# Patient Record
Sex: Female | Born: 1940 | Race: White | Hispanic: No | Marital: Married | Smoking: Never smoker
Health system: Southern US, Community
[De-identification: ages and names within clinical notes are randomized; demographics above are authoritative.]

## PROBLEM LIST (undated history)

## (undated) DIAGNOSIS — E079 Disorder of thyroid, unspecified: Secondary | ICD-10-CM

## (undated) DIAGNOSIS — F319 Bipolar disorder, unspecified: Secondary | ICD-10-CM

## (undated) HISTORY — PX: ANKLE SURGERY: SHX546

## (undated) HISTORY — PX: KNEE SURGERY: SHX244

---

## 2005-05-19 ENCOUNTER — Ambulatory Visit: Payer: Self-pay | Admitting: Family Medicine

## 2005-05-28 ENCOUNTER — Encounter: Admission: RE | Admit: 2005-05-28 | Discharge: 2005-05-28 | Payer: Self-pay | Admitting: Family Medicine

## 2005-07-08 ENCOUNTER — Ambulatory Visit: Payer: Self-pay | Admitting: Family Medicine

## 2005-07-14 ENCOUNTER — Ambulatory Visit: Payer: Self-pay | Admitting: Family Medicine

## 2005-07-14 ENCOUNTER — Other Ambulatory Visit: Admission: RE | Admit: 2005-07-14 | Discharge: 2005-07-14 | Payer: Self-pay | Admitting: Family Medicine

## 2005-10-06 ENCOUNTER — Ambulatory Visit: Payer: Self-pay | Admitting: Family Medicine

## 2005-11-03 ENCOUNTER — Ambulatory Visit: Payer: Self-pay | Admitting: Family Medicine

## 2005-12-08 ENCOUNTER — Encounter: Payer: Self-pay | Admitting: Family Medicine

## 2005-12-08 LAB — CONVERTED CEMR LAB

## 2006-03-02 ENCOUNTER — Ambulatory Visit: Payer: Self-pay | Admitting: Family Medicine

## 2006-06-16 ENCOUNTER — Encounter: Admission: RE | Admit: 2006-06-16 | Discharge: 2006-06-16 | Payer: Self-pay | Admitting: Family Medicine

## 2006-10-15 ENCOUNTER — Ambulatory Visit: Payer: Self-pay | Admitting: Family Medicine

## 2006-10-15 LAB — CONVERTED CEMR LAB
ALT: 19 units/L (ref 0–40)
Albumin: 4 g/dL (ref 3.5–5.2)
Alkaline Phosphatase: 81 units/L (ref 39–117)
Basophils Absolute: 0 10*3/uL (ref 0.0–0.1)
Basophils Relative: 0.4 % (ref 0.0–1.0)
CO2: 32 meq/L (ref 19–32)
Chol/HDL Ratio, serum: 3.8
Cholesterol: 188 mg/dL (ref 0–200)
GFR calc non Af Amer: 60 mL/min
Glomerular Filtration Rate, Af Am: 72 mL/min/{1.73_m2}
Glucose, Bld: 102 mg/dL — ABNORMAL HIGH (ref 70–99)
LDL Cholesterol: 114 mg/dL — ABNORMAL HIGH (ref 0–99)
Lymphocytes Relative: 27.2 % (ref 12.0–46.0)
Monocytes Relative: 5.5 % (ref 3.0–11.0)
Platelets: 293 10*3/uL (ref 150–400)
Potassium: 4.6 meq/L (ref 3.5–5.1)
RBC: 4.81 M/uL (ref 3.87–5.11)
Sodium: 144 meq/L (ref 135–145)
TSH: 1.51 microintl units/mL (ref 0.35–5.50)
Total Bilirubin: 0.9 mg/dL (ref 0.3–1.2)
Total Protein: 7.5 g/dL (ref 6.0–8.3)
VLDL: 24 mg/dL (ref 0–40)
WBC: 4.8 10*3/uL (ref 4.5–10.5)

## 2006-11-13 ENCOUNTER — Ambulatory Visit: Payer: Self-pay | Admitting: Family Medicine

## 2006-11-13 ENCOUNTER — Other Ambulatory Visit: Admission: RE | Admit: 2006-11-13 | Discharge: 2006-11-13 | Payer: Self-pay | Admitting: Family Medicine

## 2006-11-13 ENCOUNTER — Encounter: Payer: Self-pay | Admitting: Family Medicine

## 2006-11-30 ENCOUNTER — Encounter: Admission: RE | Admit: 2006-11-30 | Discharge: 2006-11-30 | Payer: Self-pay | Admitting: Orthopedic Surgery

## 2006-12-16 ENCOUNTER — Ambulatory Visit: Payer: Self-pay | Admitting: Family Medicine

## 2007-01-07 ENCOUNTER — Ambulatory Visit: Payer: Self-pay | Admitting: Family Medicine

## 2007-01-18 ENCOUNTER — Ambulatory Visit: Payer: Self-pay | Admitting: Internal Medicine

## 2007-07-08 ENCOUNTER — Encounter: Admission: RE | Admit: 2007-07-08 | Discharge: 2007-07-08 | Payer: Self-pay | Admitting: Family Medicine

## 2007-08-17 DIAGNOSIS — M1610 Unilateral primary osteoarthritis, unspecified hip: Secondary | ICD-10-CM | POA: Insufficient documentation

## 2007-08-17 DIAGNOSIS — M169 Osteoarthritis of hip, unspecified: Secondary | ICD-10-CM

## 2007-08-20 ENCOUNTER — Ambulatory Visit: Payer: Self-pay | Admitting: Family Medicine

## 2007-09-04 ENCOUNTER — Encounter: Payer: Self-pay | Admitting: Family Medicine

## 2007-09-04 DIAGNOSIS — F319 Bipolar disorder, unspecified: Secondary | ICD-10-CM | POA: Insufficient documentation

## 2007-09-04 DIAGNOSIS — R32 Unspecified urinary incontinence: Secondary | ICD-10-CM | POA: Insufficient documentation

## 2007-09-04 DIAGNOSIS — E039 Hypothyroidism, unspecified: Secondary | ICD-10-CM | POA: Insufficient documentation

## 2007-09-16 ENCOUNTER — Encounter: Payer: Self-pay | Admitting: Family Medicine

## 2007-09-16 ENCOUNTER — Ambulatory Visit: Payer: Self-pay | Admitting: Family Medicine

## 2007-09-16 ENCOUNTER — Encounter (INDEPENDENT_AMBULATORY_CARE_PROVIDER_SITE_OTHER): Payer: Self-pay | Admitting: *Deleted

## 2008-07-12 ENCOUNTER — Encounter: Admission: RE | Admit: 2008-07-12 | Discharge: 2008-07-12 | Payer: Self-pay | Admitting: Family Medicine

## 2008-07-20 ENCOUNTER — Encounter: Admission: RE | Admit: 2008-07-20 | Discharge: 2008-07-20 | Payer: Self-pay | Admitting: Family Medicine

## 2008-08-18 IMAGING — CR DG WRIST*L* EXAM
1 series · 1 of 1 positions shown · non-contrast
Comparison: none

[AP]
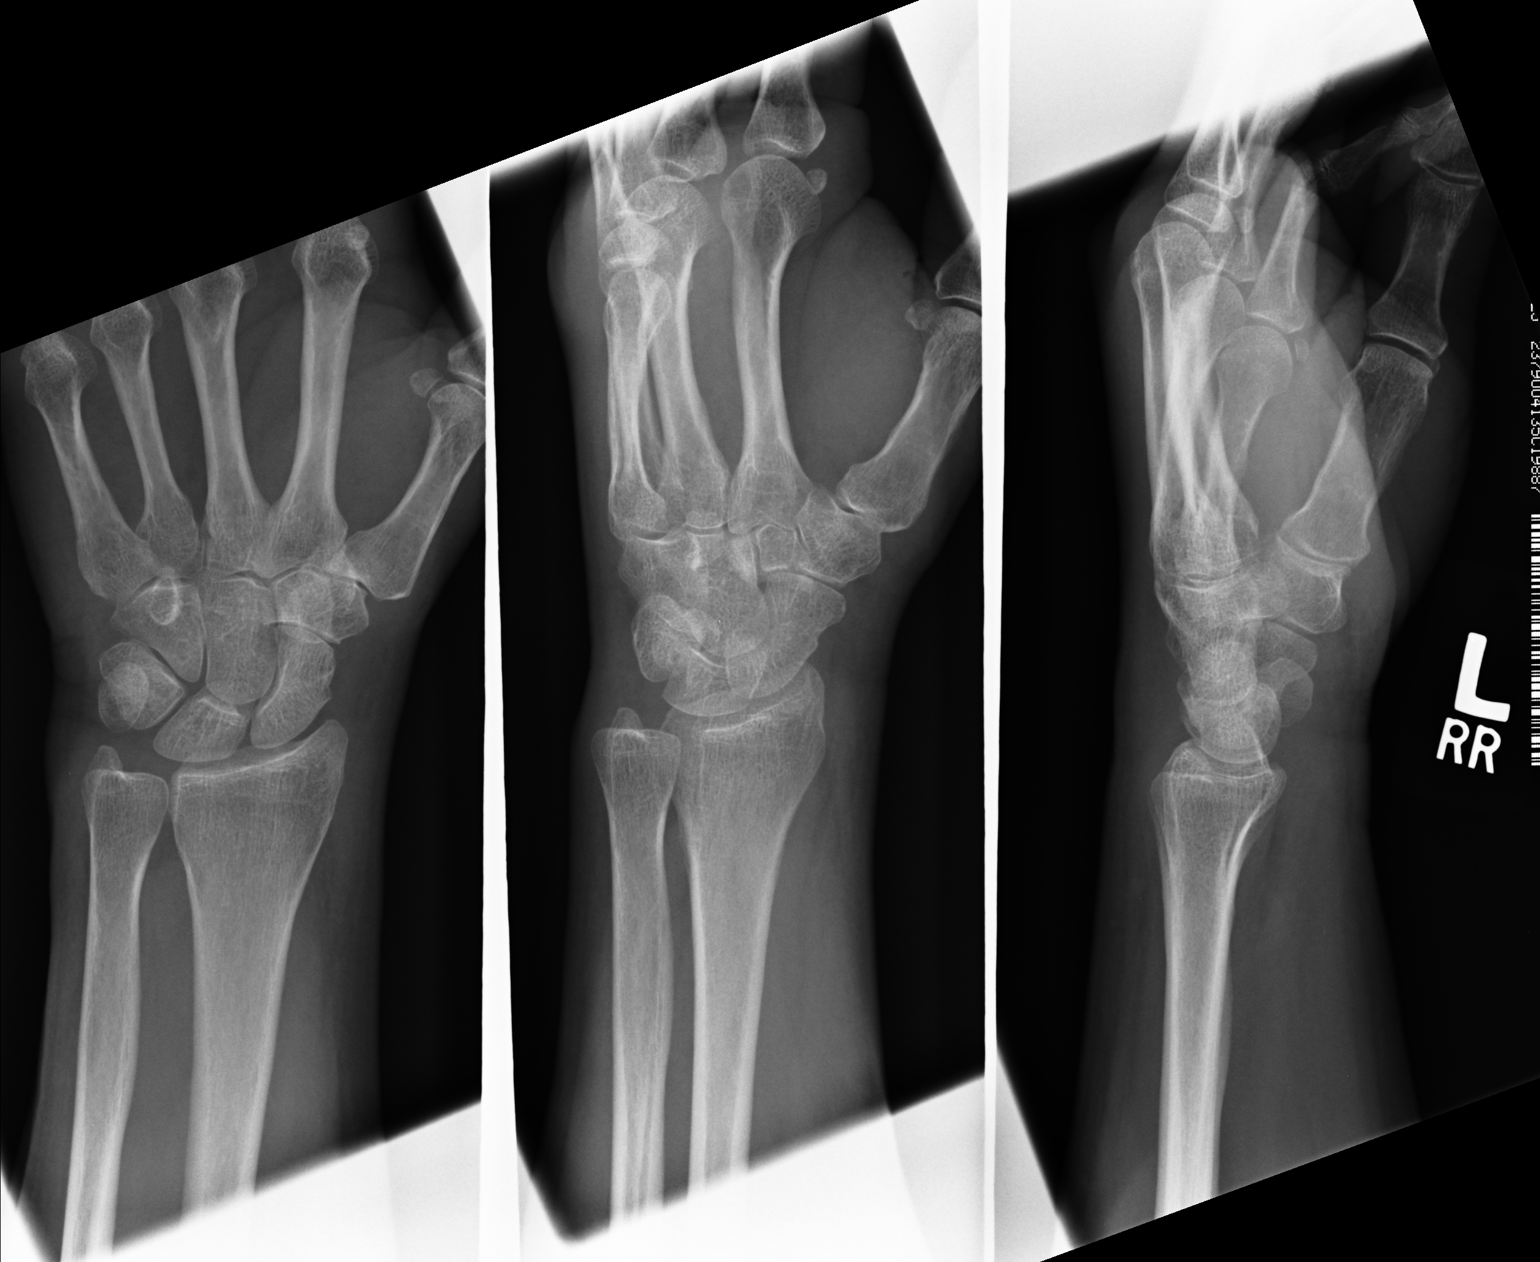

[1 of 1 positions shown; findings below may reference images not displayed]

Canned report from images found in remote index.

Refer to host system for actual result text.

## 2009-05-17 ENCOUNTER — Encounter: Admission: RE | Admit: 2009-05-17 | Discharge: 2009-07-09 | Payer: Self-pay | Admitting: Orthopedic Surgery

## 2009-07-17 ENCOUNTER — Encounter: Admission: RE | Admit: 2009-07-17 | Discharge: 2009-07-17 | Payer: Self-pay | Admitting: Family Medicine

## 2010-07-18 ENCOUNTER — Encounter: Admission: RE | Admit: 2010-07-18 | Discharge: 2010-07-18 | Payer: Self-pay | Admitting: Family Medicine

## 2010-12-29 ENCOUNTER — Encounter: Payer: Self-pay | Admitting: Orthopedic Surgery

## 2011-06-24 ENCOUNTER — Emergency Department (HOSPITAL_COMMUNITY)
Admission: EM | Admit: 2011-06-24 | Discharge: 2011-06-24 | Disposition: A | Discharge status: Home / Self Care | Payer: Medicare Other | Attending: Emergency Medicine | Admitting: Emergency Medicine

## 2011-06-24 DIAGNOSIS — E039 Hypothyroidism, unspecified: Secondary | ICD-10-CM | POA: Insufficient documentation

## 2011-06-24 DIAGNOSIS — F319 Bipolar disorder, unspecified: Secondary | ICD-10-CM | POA: Insufficient documentation

## 2011-06-24 DIAGNOSIS — Z79899 Other long term (current) drug therapy: Secondary | ICD-10-CM | POA: Insufficient documentation

## 2011-06-24 DIAGNOSIS — M79609 Pain in unspecified limb: Secondary | ICD-10-CM | POA: Insufficient documentation

## 2011-07-03 ENCOUNTER — Other Ambulatory Visit: Payer: Self-pay | Admitting: Family Medicine

## 2011-07-03 DIAGNOSIS — Z1231 Encounter for screening mammogram for malignant neoplasm of breast: Secondary | ICD-10-CM

## 2011-07-23 ENCOUNTER — Ambulatory Visit
Admission: RE | Admit: 2011-07-23 | Discharge: 2011-07-23 | Disposition: A | Discharge status: Home / Self Care | Payer: Medicare Other | Source: Ambulatory Visit | Attending: Family Medicine | Admitting: Family Medicine

## 2011-07-23 DIAGNOSIS — Z1231 Encounter for screening mammogram for malignant neoplasm of breast: Secondary | ICD-10-CM

## 2012-06-21 ENCOUNTER — Other Ambulatory Visit: Payer: Self-pay | Admitting: Family Medicine

## 2012-06-21 DIAGNOSIS — Z1231 Encounter for screening mammogram for malignant neoplasm of breast: Secondary | ICD-10-CM

## 2012-07-26 ENCOUNTER — Ambulatory Visit: Payer: Medicare Other

## 2012-07-27 ENCOUNTER — Ambulatory Visit: Payer: Medicare Other

## 2012-07-30 ENCOUNTER — Ambulatory Visit
Admission: RE | Admit: 2012-07-30 | Discharge: 2012-07-30 | Disposition: A | Discharge status: Home / Self Care | Payer: Medicare Other | Source: Ambulatory Visit | Attending: Family Medicine | Admitting: Family Medicine

## 2012-07-30 DIAGNOSIS — Z1231 Encounter for screening mammogram for malignant neoplasm of breast: Secondary | ICD-10-CM

## 2012-08-03 ENCOUNTER — Other Ambulatory Visit: Payer: Self-pay | Admitting: Family Medicine

## 2012-08-03 DIAGNOSIS — Z780 Asymptomatic menopausal state: Secondary | ICD-10-CM

## 2012-08-06 ENCOUNTER — Other Ambulatory Visit: Payer: Medicare Other

## 2012-08-16 ENCOUNTER — Other Ambulatory Visit: Payer: Medicare Other

## 2012-08-16 ENCOUNTER — Ambulatory Visit
Admission: RE | Admit: 2012-08-16 | Discharge: 2012-08-16 | Disposition: A | Discharge status: Home / Self Care | Payer: Medicare Other | Source: Ambulatory Visit | Attending: Family Medicine | Admitting: Family Medicine

## 2012-08-16 DIAGNOSIS — Z780 Asymptomatic menopausal state: Secondary | ICD-10-CM

## 2013-06-21 ENCOUNTER — Other Ambulatory Visit: Payer: Self-pay

## 2013-06-21 DIAGNOSIS — Z1231 Encounter for screening mammogram for malignant neoplasm of breast: Secondary | ICD-10-CM

## 2013-08-01 ENCOUNTER — Ambulatory Visit: Payer: Medicare Other

## 2013-08-29 ENCOUNTER — Ambulatory Visit
Admission: RE | Admit: 2013-08-29 | Discharge: 2013-08-29 | Disposition: A | Discharge status: Home / Self Care | Payer: Medicare Other | Source: Ambulatory Visit

## 2013-08-29 DIAGNOSIS — Z1231 Encounter for screening mammogram for malignant neoplasm of breast: Secondary | ICD-10-CM

## 2014-07-28 ENCOUNTER — Other Ambulatory Visit: Payer: Self-pay

## 2014-07-28 DIAGNOSIS — Z1231 Encounter for screening mammogram for malignant neoplasm of breast: Secondary | ICD-10-CM

## 2014-09-19 ENCOUNTER — Ambulatory Visit
Admission: RE | Admit: 2014-09-19 | Discharge: 2014-09-19 | Disposition: A | Discharge status: Home / Self Care | Payer: Medicare Other | Source: Ambulatory Visit

## 2014-09-19 DIAGNOSIS — Z1231 Encounter for screening mammogram for malignant neoplasm of breast: Secondary | ICD-10-CM

## 2014-10-22 ENCOUNTER — Emergency Department (HOSPITAL_BASED_OUTPATIENT_CLINIC_OR_DEPARTMENT_OTHER)
Admission: EM | Admit: 2014-10-22 | Discharge: 2014-10-22 | Disposition: A | Discharge status: Home / Self Care | Payer: Medicare Other | Attending: Emergency Medicine | Admitting: Emergency Medicine

## 2014-10-22 ENCOUNTER — Encounter (HOSPITAL_BASED_OUTPATIENT_CLINIC_OR_DEPARTMENT_OTHER): Payer: Self-pay | Admitting: *Deleted

## 2014-10-22 ENCOUNTER — Emergency Department (HOSPITAL_BASED_OUTPATIENT_CLINIC_OR_DEPARTMENT_OTHER): Payer: Medicare Other

## 2014-10-22 DIAGNOSIS — Y9389 Activity, other specified: Secondary | ICD-10-CM | POA: Insufficient documentation

## 2014-10-22 DIAGNOSIS — Y998 Other external cause status: Secondary | ICD-10-CM | POA: Diagnosis not present

## 2014-10-22 DIAGNOSIS — S92902A Unspecified fracture of left foot, initial encounter for closed fracture: Secondary | ICD-10-CM

## 2014-10-22 DIAGNOSIS — S92315A Nondisplaced fracture of first metatarsal bone, left foot, initial encounter for closed fracture: Secondary | ICD-10-CM | POA: Diagnosis not present

## 2014-10-22 DIAGNOSIS — E079 Disorder of thyroid, unspecified: Secondary | ICD-10-CM | POA: Diagnosis not present

## 2014-10-22 DIAGNOSIS — S99922A Unspecified injury of left foot, initial encounter: Secondary | ICD-10-CM | POA: Diagnosis present

## 2014-10-22 DIAGNOSIS — F319 Bipolar disorder, unspecified: Secondary | ICD-10-CM | POA: Diagnosis not present

## 2014-10-22 DIAGNOSIS — W01198A Fall on same level from slipping, tripping and stumbling with subsequent striking against other object, initial encounter: Secondary | ICD-10-CM | POA: Diagnosis not present

## 2014-10-22 DIAGNOSIS — Y929 Unspecified place or not applicable: Secondary | ICD-10-CM | POA: Diagnosis not present

## 2014-10-22 DIAGNOSIS — W19XXXA Unspecified fall, initial encounter: Secondary | ICD-10-CM

## 2014-10-22 HISTORY — DX: Bipolar disorder, unspecified: F31.9

## 2014-10-22 HISTORY — DX: Disorder of thyroid, unspecified: E07.9

## 2014-10-22 MED ORDER — TRAMADOL HCL 50 MG PO TABS
50.0000 mg | ORAL_TABLET | Freq: Four times a day (QID) | ORAL | Status: AC | PRN
Start: 2014-10-22 — End: ?

## 2014-10-22 MED ORDER — TRAMADOL HCL 50 MG PO TABS
50.0000 mg | ORAL_TABLET | Freq: Once | ORAL | Status: AC
Start: 2014-10-22 — End: 2014-10-22
  Administered 2014-10-22: 50 mg via ORAL
  Filled 2014-10-22: qty 1

## 2014-10-22 NOTE — ED Provider Notes (Signed)
CSN: [id number]     Arrival date & time 10/22/14  0912 History   First MD Initiated Contact with Patient 10/22/14 [phone]     Chief Complaint  Patient presents with  . Fall     (Consider location/radiation/quality/duration/timing/severity/associated sxs/prior Treatment) Patient is a 73 y.o. female presenting with fall. The history is provided by the patient.  Fall Pertinent negatives include no chest pain, no abdominal pain, no headaches and no shortness of breath.  patient slipped in the shower this morning. Did not pass out no loss of consciousness. Injury isolated to the left forefoot. Patient with pain and swelling there. Patient not able to walk on the foot. No other injuries.  Past Medical History  Diagnosis Date  . Thyroid disease   . Bipolar disorder    Past Surgical History  Procedure Laterality Date  . Ankle surgery Left   . Knee surgery     History reviewed. No pertinent family history. History  Substance Use Topics  . Smoking status: Never Smoker   . Smokeless tobacco: Not on file  . Alcohol Use: Yes   OB History    No data available     Review of Systems  Constitutional: Negative for fever.  HENT: Negative for congestion.   Eyes: Negative for visual disturbance.  Respiratory: Negative for shortness of breath.   Cardiovascular: Negative for chest pain.  Gastrointestinal: Negative for abdominal pain.  Genitourinary: Negative for hematuria.  Musculoskeletal: Positive for joint swelling. Negative for back pain and neck pain.  Skin: Negative for rash.  Neurological: Negative for headaches.  Hematological: Does not bruise/bleed easily.  Psychiatric/Behavioral: Negative for confusion.      Allergies  Aspirin and Sulfonamide derivatives  Home Medications   Prior to Admission medications   Medication Sig Start Date End Date Taking? Authorizing Provider  LamoTRIgine (LAMICTAL PO) Take by mouth.   Yes Historical Provider, MD  LEVOTHYROXINE SODIUM PO Take  by mouth.   Yes Historical Provider, MD  LITHIUM PO Take by mouth.   Yes Historical Provider, MD  Pramipexole Dihydrochloride (MIRAPEX PO) Take by mouth.   Yes Historical Provider, MD  traMADol (ULTRAM) 50 MG tablet Take 1 tablet (50 mg total) by mouth every 6 (six) hours as needed. 10/22/14   Vanetta MuldersScott Daxon Kyne, MD   BP 108/85 mmHg  Pulse 71  Temp(Src) 98.5 F (36.9 C) (Oral)  Resp 18  Ht 5\' 6"  (1.676 m)  Wt 180 lb (81.647 kg)  BMI 29.07 kg/m2  SpO2 94% Physical Exam  Constitutional: She is oriented to person, place, and time. She appears well-developed and well-nourished. No distress.  HENT:  Head: Normocephalic and atraumatic.  Eyes: Conjunctivae and EOM are normal. Pupils are equal, round, and reactive to light.  Neck: Normal range of motion.  Cardiovascular: Normal rate, regular rhythm and normal heart sounds.   No murmur heard. Pulmonary/Chest: Effort normal and breath sounds normal. No respiratory distress.  Abdominal: Soft. Bowel sounds are normal. There is no tenderness.  Musculoskeletal: Normal range of motion. She exhibits edema and tenderness.  Patient was swelling to the left forefoot. Some evidence of bruising. Refill to great toe is 2 seconds. No ankle swelling. No proximal leg tenderness. No knee tenderness. No knee tenderness.  Neurological: She is alert and oriented to person, place, and time. No cranial nerve deficit. She exhibits normal muscle tone. Coordination normal.  Skin: Skin is warm. No rash noted.  Nursing note and vitals reviewed.   ED Course  Procedures (including critical care  time) Labs Review Labs Reviewed - No data to display  Imaging Review Dg Foot Complete Left  10/22/2014   CLINICAL DATA:  Fall, acute pain and bruising across the metatarsals  EXAM: LEFT FOOT - COMPLETE 3+ VIEW  COMPARISON:  None.  FINDINGS: Normal alignment. Mild osteopenia. Degenerative changes and mild hallux deformity of the left first MTP joint. On the oblique view, subtle  lucency and cortical step-off suspected of the left first metatarsal along the articular surface. This is suspicious for a first metatarsal nondisplaced fracture. The other metatarsals appear intact. No other significant abnormality.  IMPRESSION: Findings suspicious for a subtle nondisplaced fracture left first metatarsal base.  Osteopenia and left first MTP degenerative change and hallux valgus deformity.   Electronically Signed   By: Ruel Favorsrevor  Shick M.D.   On: 10/22/2014 10:37     EKG Interpretation None      MDM   Final diagnoses:  Fall  Foot fracture, left, closed, initial encounter    Patient with injury to her left foot. No ankle tenderness no proximal leg tenderness no knee tenderness. Patient was swelling to the forefoot. X-rays are consistent with a nondisplaced fracture of the left first metatarsal base.  Patient splinted with a posterior splint. Nonweightbearing crutches provided. Pain medication provided. No other injuries. Placement of the splint is proper. Patient has good cap refill following placement of the splint.    Vanetta MuldersScott Adelynn Gipe, MD 10/22/14 1120

## 2014-10-22 NOTE — ED Notes (Signed)
Patient states she slipped in the shower this morning and c/o pain in left ankle/foot.

## 2014-10-22 NOTE — Discharge Instructions (Signed)
Cast or Splint Care Casts and splints support injured limbs and keep bones from moving while they heal.  HOME CARE  Keep the cast or splint uncovered during the drying period.  A plaster cast can take 24 to 48 hours to dry.  A fiberglass cast will dry in less than 1 hour.  Do not rest the cast on anything harder than a pillow for 24 hours.  Do not put weight on your injured limb. Do not put pressure on the cast. Wait for your doctor's approval.  Keep the cast or splint dry.  Cover the cast or splint with a plastic bag during baths or wet weather.  If you have a cast over your chest and belly (trunk), take sponge baths until the cast is taken off.  If your cast gets wet, dry it with a towel or blow dryer. Use the cool setting on the blow dryer.  Keep your cast or splint clean. Wash a dirty cast with a damp cloth.  Do not put any objects under your cast or splint.  Do not scratch the skin under the cast with an object. If itching is a problem, use a blow dryer on a cool setting over the itchy area.  Do not trim or cut your cast.  Do not take out the padding from inside your cast.  Exercise your joints near the cast as told by your doctor.  Raise (elevate) your injured limb on 1 or 2 pillows for the first 1 to 3 days. GET HELP IF:  Your cast or splint cracks.  Your cast or splint is too tight or too loose.  You itch badly under the cast.  Your cast gets wet or has a soft spot.  You have a bad smell coming from the cast.  You get an object stuck under the cast.  Your skin around the cast becomes red or sore.  You have new or more pain after the cast is put on. GET HELP RIGHT AWAY IF:  You have fluid leaking through the cast.  You cannot move your fingers or toes.  Your fingers or toes turn blue or white or are cool, painful, or puffy (swollen).  You have tingling or lose feeling (numbness) around the injured area.  You have bad pain or pressure under the  cast.  You have trouble breathing or have shortness of breath.  You have chest pain. Document Released: 03/26/2011 Document Revised: 07/27/2013 Document Reviewed: 06/02/2013 [facility] Patient Information 2015 [employer], [place]. This information is not intended to replace advice given to you by your health care provider. Make sure you discuss any questions you have with your health care provider.  Follow-up with your orthopedic doctor. Give them a call on Monday. Keep the splint dry. Elevate your left leg is much as possible. Use the crutches. Do not put weight on the left foot.

## 2014-10-22 NOTE — ED Notes (Signed)
Pt given ice pack

## 2014-12-14 DIAGNOSIS — E039 Hypothyroidism, unspecified: Secondary | ICD-10-CM | POA: Diagnosis not present

## 2014-12-14 DIAGNOSIS — F319 Bipolar disorder, unspecified: Secondary | ICD-10-CM | POA: Diagnosis not present

## 2014-12-14 DIAGNOSIS — Z79899 Other long term (current) drug therapy: Secondary | ICD-10-CM | POA: Diagnosis not present

## 2014-12-14 DIAGNOSIS — Z5181 Encounter for therapeutic drug level monitoring: Secondary | ICD-10-CM | POA: Diagnosis not present

## 2015-01-04 DIAGNOSIS — G509 Disorder of trigeminal nerve, unspecified: Secondary | ICD-10-CM | POA: Diagnosis not present

## 2015-01-04 DIAGNOSIS — E039 Hypothyroidism, unspecified: Secondary | ICD-10-CM | POA: Diagnosis not present

## 2015-02-01 DIAGNOSIS — G500 Trigeminal neuralgia: Secondary | ICD-10-CM | POA: Diagnosis not present

## 2015-02-01 DIAGNOSIS — E039 Hypothyroidism, unspecified: Secondary | ICD-10-CM | POA: Diagnosis not present

## 2015-10-08 ENCOUNTER — Other Ambulatory Visit: Payer: Self-pay

## 2015-10-08 DIAGNOSIS — Z1231 Encounter for screening mammogram for malignant neoplasm of breast: Secondary | ICD-10-CM

## 2015-10-23 ENCOUNTER — Ambulatory Visit: Payer: Self-pay

## 2015-10-29 ENCOUNTER — Ambulatory Visit
Admission: RE | Admit: 2015-10-29 | Discharge: 2015-10-29 | Disposition: A | Discharge status: Home / Self Care | Payer: Medicare Other | Source: Ambulatory Visit

## 2015-10-29 DIAGNOSIS — Z1231 Encounter for screening mammogram for malignant neoplasm of breast: Secondary | ICD-10-CM

## 2015-10-31 ENCOUNTER — Other Ambulatory Visit: Payer: Self-pay | Admitting: Family Medicine

## 2015-10-31 DIAGNOSIS — R928 Other abnormal and inconclusive findings on diagnostic imaging of breast: Secondary | ICD-10-CM

## 2015-11-08 ENCOUNTER — Ambulatory Visit
Admission: RE | Admit: 2015-11-08 | Discharge: 2015-11-08 | Disposition: A | Discharge status: Home / Self Care | Payer: Medicare Other | Source: Ambulatory Visit | Attending: Family Medicine | Admitting: Family Medicine

## 2015-11-08 ENCOUNTER — Other Ambulatory Visit: Payer: Self-pay | Admitting: Family Medicine

## 2015-11-08 DIAGNOSIS — R928 Other abnormal and inconclusive findings on diagnostic imaging of breast: Secondary | ICD-10-CM

## 2016-10-08 ENCOUNTER — Other Ambulatory Visit: Payer: Self-pay | Admitting: Family Medicine

## 2016-10-08 DIAGNOSIS — Z1231 Encounter for screening mammogram for malignant neoplasm of breast: Secondary | ICD-10-CM

## 2016-10-29 ENCOUNTER — Ambulatory Visit
Admission: RE | Admit: 2016-10-29 | Discharge: 2016-10-29 | Disposition: A | Discharge status: Home / Self Care | Payer: Medicare Other | Source: Ambulatory Visit | Attending: Family Medicine | Admitting: Family Medicine

## 2016-10-29 DIAGNOSIS — Z1231 Encounter for screening mammogram for malignant neoplasm of breast: Secondary | ICD-10-CM

## 2017-09-29 ENCOUNTER — Other Ambulatory Visit: Payer: Self-pay | Admitting: Family Medicine

## 2017-09-29 DIAGNOSIS — Z1231 Encounter for screening mammogram for malignant neoplasm of breast: Secondary | ICD-10-CM

## 2017-11-02 ENCOUNTER — Ambulatory Visit
Admission: RE | Admit: 2017-11-02 | Discharge: 2017-11-02 | Disposition: A | Discharge status: Home / Self Care | Payer: Medicare Other | Source: Ambulatory Visit | Attending: Family Medicine | Admitting: Family Medicine

## 2017-11-02 DIAGNOSIS — Z1231 Encounter for screening mammogram for malignant neoplasm of breast: Secondary | ICD-10-CM

## 2018-09-30 ENCOUNTER — Other Ambulatory Visit: Payer: Self-pay | Admitting: Family Medicine

## 2018-09-30 DIAGNOSIS — Z1231 Encounter for screening mammogram for malignant neoplasm of breast: Secondary | ICD-10-CM

## 2018-11-10 ENCOUNTER — Ambulatory Visit
Admission: RE | Admit: 2018-11-10 | Discharge: 2018-11-10 | Disposition: A | Discharge status: Home / Self Care | Payer: Medicare Other | Source: Ambulatory Visit | Attending: Family Medicine | Admitting: Family Medicine

## 2018-11-10 ENCOUNTER — Other Ambulatory Visit: Payer: Self-pay | Admitting: Family Medicine

## 2018-11-10 DIAGNOSIS — Z1231 Encounter for screening mammogram for malignant neoplasm of breast: Secondary | ICD-10-CM

## 2019-04-19 ENCOUNTER — Other Ambulatory Visit: Payer: Self-pay | Admitting: Orthopedic Surgery

## 2019-04-19 DIAGNOSIS — M4316 Spondylolisthesis, lumbar region: Secondary | ICD-10-CM

## 2019-04-28 ENCOUNTER — Other Ambulatory Visit: Payer: Self-pay

## 2019-04-28 ENCOUNTER — Ambulatory Visit
Admission: RE | Admit: 2019-04-28 | Discharge: 2019-04-28 | Disposition: A | Discharge status: Home / Self Care | Payer: Medicare Other | Source: Ambulatory Visit | Attending: Orthopedic Surgery | Admitting: Orthopedic Surgery

## 2019-04-28 DIAGNOSIS — M4316 Spondylolisthesis, lumbar region: Secondary | ICD-10-CM

## 2019-10-12 ENCOUNTER — Other Ambulatory Visit: Payer: Self-pay | Admitting: Family Medicine

## 2019-10-12 DIAGNOSIS — Z1231 Encounter for screening mammogram for malignant neoplasm of breast: Secondary | ICD-10-CM

## 2019-12-06 ENCOUNTER — Other Ambulatory Visit: Payer: Self-pay

## 2019-12-06 ENCOUNTER — Ambulatory Visit
Admission: RE | Admit: 2019-12-06 | Discharge: 2019-12-06 | Disposition: A | Discharge status: Home / Self Care | Payer: Medicare Other | Source: Ambulatory Visit | Attending: Family Medicine | Admitting: Family Medicine

## 2019-12-06 DIAGNOSIS — Z1231 Encounter for screening mammogram for malignant neoplasm of breast: Secondary | ICD-10-CM

## 2020-02-23 ENCOUNTER — Ambulatory Visit: Payer: Medicare Other | Attending: Internal Medicine

## 2020-02-23 DIAGNOSIS — Z23 Encounter for immunization: Secondary | ICD-10-CM

## 2020-02-23 NOTE — Progress Notes (Signed)
   Covid-19 Vaccination Clinic  Name:  Jeanne Jackson    MRN: [id number] DOB: 1941-01-03  02/23/2020  Jeanne Jackson was observed post Covid-19 immunization for 30 minutes based on pre-vaccination screening without incident. She was provided with Vaccine Information Sheet and instruction to access the V-Safe system.   Jeanne Jackson was instructed to call 911 with any severe reactions post vaccine: Marland Kitchen Difficulty breathing  . Swelling of face and throat  . A fast heartbeat  . A bad rash all over body  . Dizziness and weakness   Immunizations Administered    Name Date Dose VIS Date Route   Pfizer COVID-19 Vaccine 02/23/2020 10:59 AM 0.3 mL 11/18/2019 Intramuscular   Manufacturer: [employer], [employer]   Lot: [id number]   NDC: [address]-5

## 2020-03-12 ENCOUNTER — Ambulatory Visit: Payer: Medicare Other

## 2020-03-19 ENCOUNTER — Ambulatory Visit: Payer: Medicare Other | Attending: Internal Medicine

## 2020-03-19 DIAGNOSIS — Z23 Encounter for immunization: Secondary | ICD-10-CM

## 2020-03-19 NOTE — Progress Notes (Signed)
   Covid-19 Vaccination Clinic  Name:  Jeanne Jackson    MRN: [id number] DOB: Sep 30, 1941  03/19/2020  [name] was observed post Covid-19 immunization for 15 minutes without incident. She was provided with Vaccine Information Sheet and instruction to access the V-Safe system.   [name] was instructed to call 911 with any severe reactions post vaccine: Marland Kitchen Difficulty breathing  . Swelling of face and throat  . A fast heartbeat  . A bad rash all over body  . Dizziness and weakness   Immunizations Administered    Name Date Dose VIS Date Route   Pfizer COVID-19 Vaccine 03/19/2020  9:30 AM 0.3 mL 11/18/2019 Intramuscular   Manufacturer: [employer], [employer]   Lot: [id number]   NDC: [address]-0

## 2020-11-05 ENCOUNTER — Other Ambulatory Visit: Payer: Self-pay | Admitting: Family Medicine

## 2020-11-05 DIAGNOSIS — Z1231 Encounter for screening mammogram for malignant neoplasm of breast: Secondary | ICD-10-CM

## 2020-12-17 ENCOUNTER — Ambulatory Visit
Admission: RE | Admit: 2020-12-17 | Discharge: 2020-12-17 | Disposition: A | Discharge status: Home / Self Care | Payer: Medicare Other | Source: Ambulatory Visit | Attending: Family Medicine | Admitting: Family Medicine

## 2020-12-17 ENCOUNTER — Other Ambulatory Visit: Payer: Self-pay

## 2020-12-17 DIAGNOSIS — Z1231 Encounter for screening mammogram for malignant neoplasm of breast: Secondary | ICD-10-CM

## 2021-11-21 ENCOUNTER — Other Ambulatory Visit: Payer: Self-pay | Admitting: Family Medicine

## 2021-11-21 DIAGNOSIS — Z1231 Encounter for screening mammogram for malignant neoplasm of breast: Secondary | ICD-10-CM

## 2021-12-25 ENCOUNTER — Ambulatory Visit: Payer: Medicare Other

## 2022-01-07 ENCOUNTER — Ambulatory Visit
Admission: RE | Admit: 2022-01-07 | Discharge: 2022-01-07 | Disposition: A | Discharge status: Home / Self Care | Payer: Medicare Other | Source: Ambulatory Visit | Attending: Family Medicine | Admitting: Family Medicine

## 2022-01-07 DIAGNOSIS — Z1231 Encounter for screening mammogram for malignant neoplasm of breast: Secondary | ICD-10-CM

## 2023-10-27 IMAGING — MG MM DIGITAL SCREENING BILAT W/ TOMO AND CAD
6 of 10 series · 6 of 30 positions shown · non-contrast
Comparison: Previous exam(s).

CLINICAL DATA: Screening.

EXAM:
DIGITAL SCREENING BILATERAL MAMMOGRAM WITH TOMOSYNTHESIS AND CAD
TECHNIQUE: Bilateral screening digital craniocaudal and mediolateral oblique
mammograms were obtained. Bilateral screening digital breast
tomosynthesis was performed. The images were evaluated with
computer-aided detection.

[R MLO synth-2D]
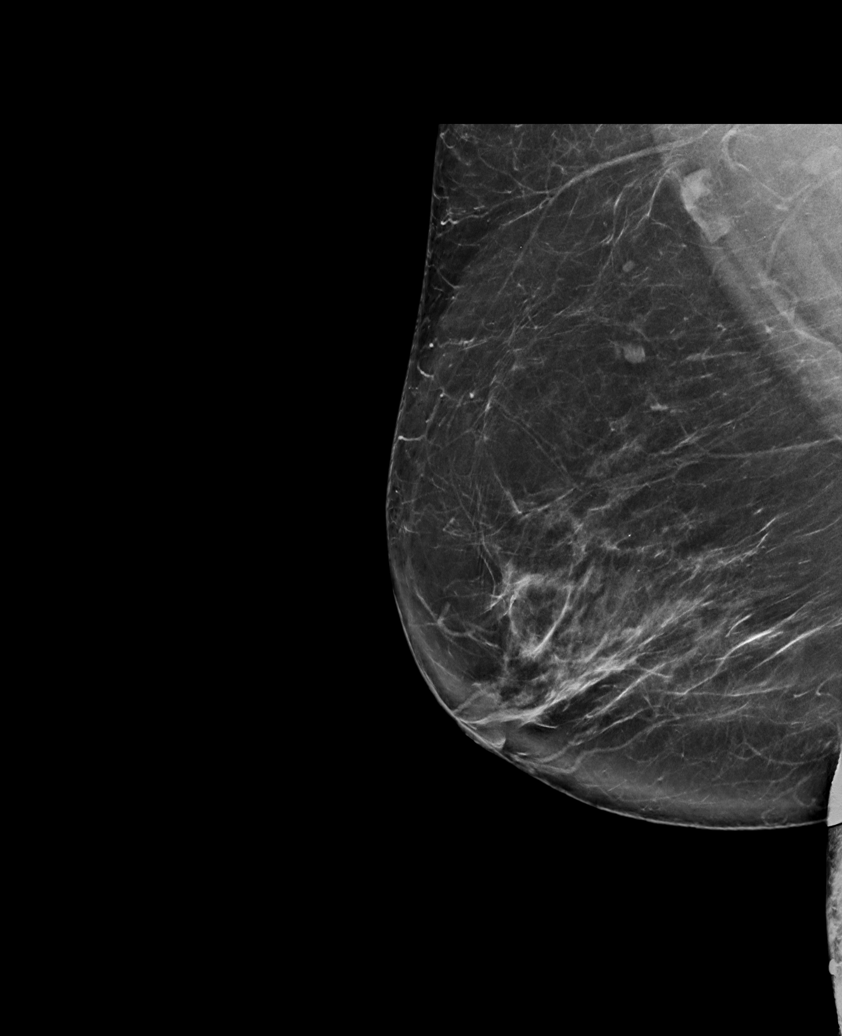

[L CC synth-2D]
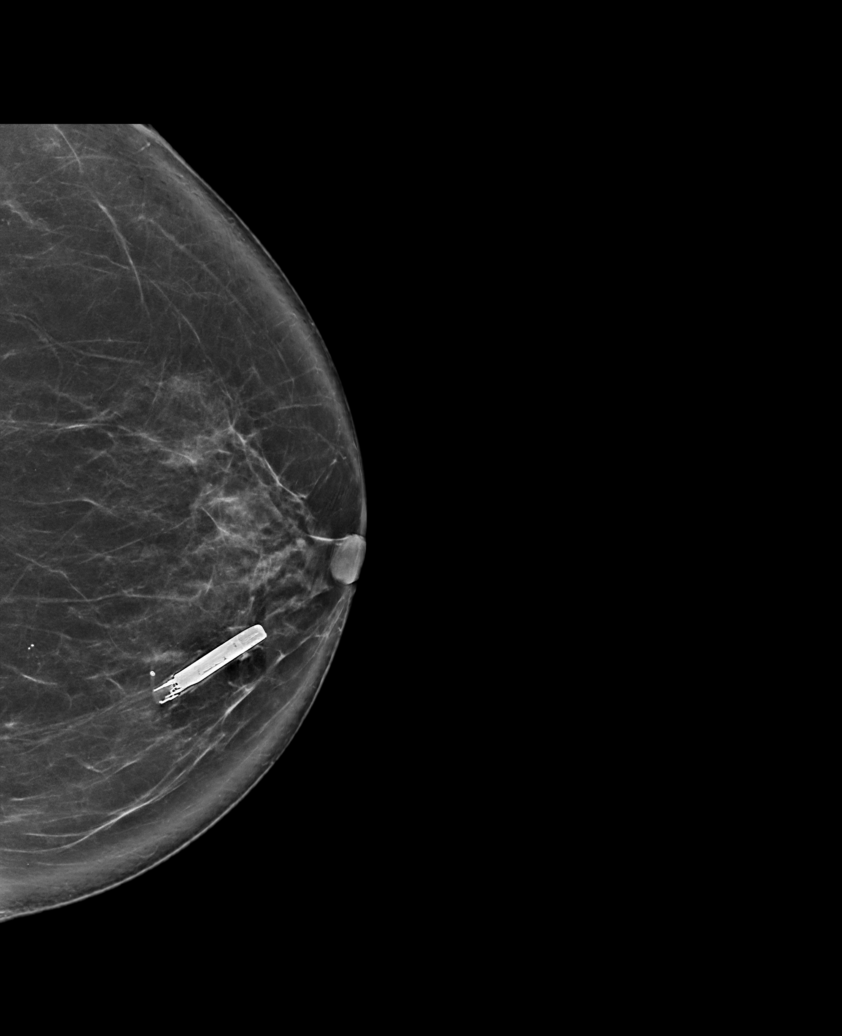

[R CC synth-2D (1 of 2)]
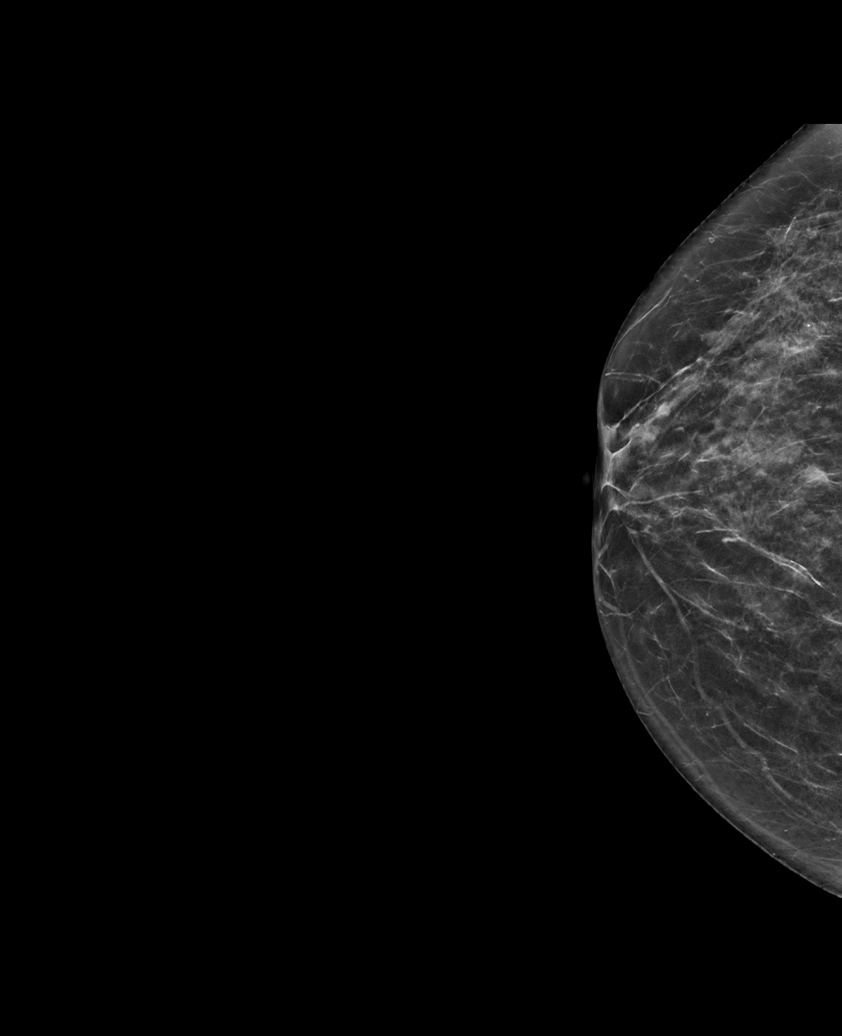

[R CC synth-2D (2 of 2)]
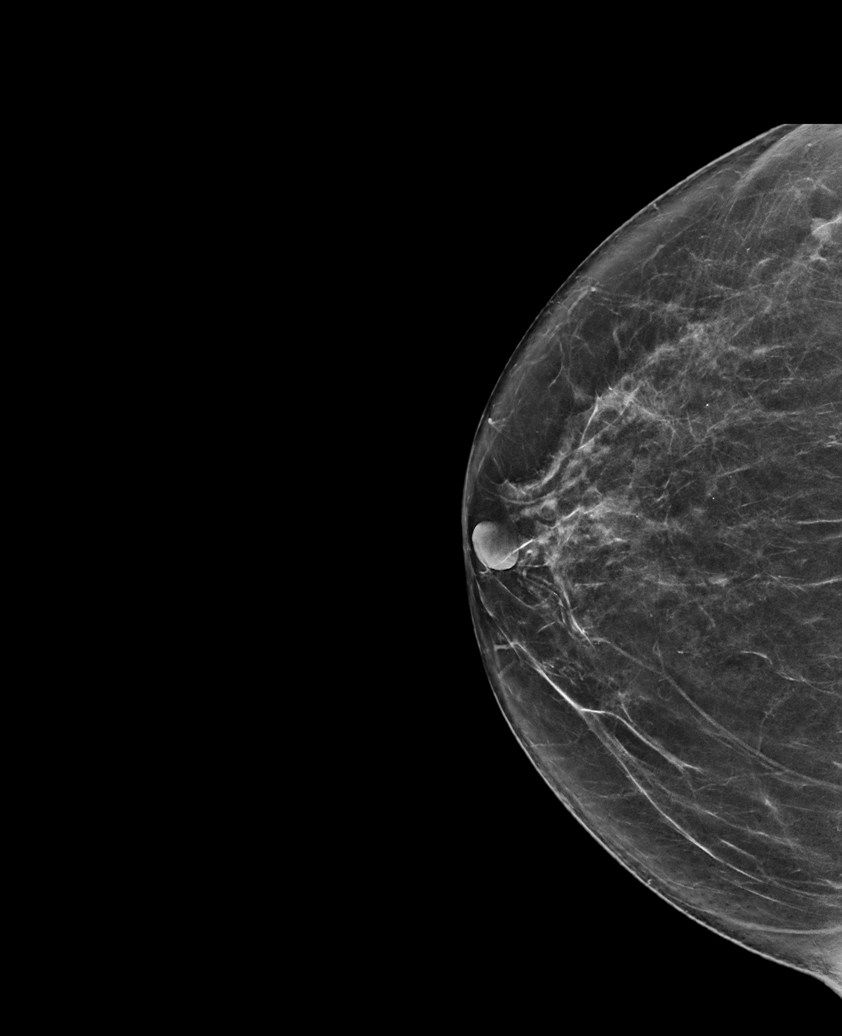

[L MLO synth-2D]
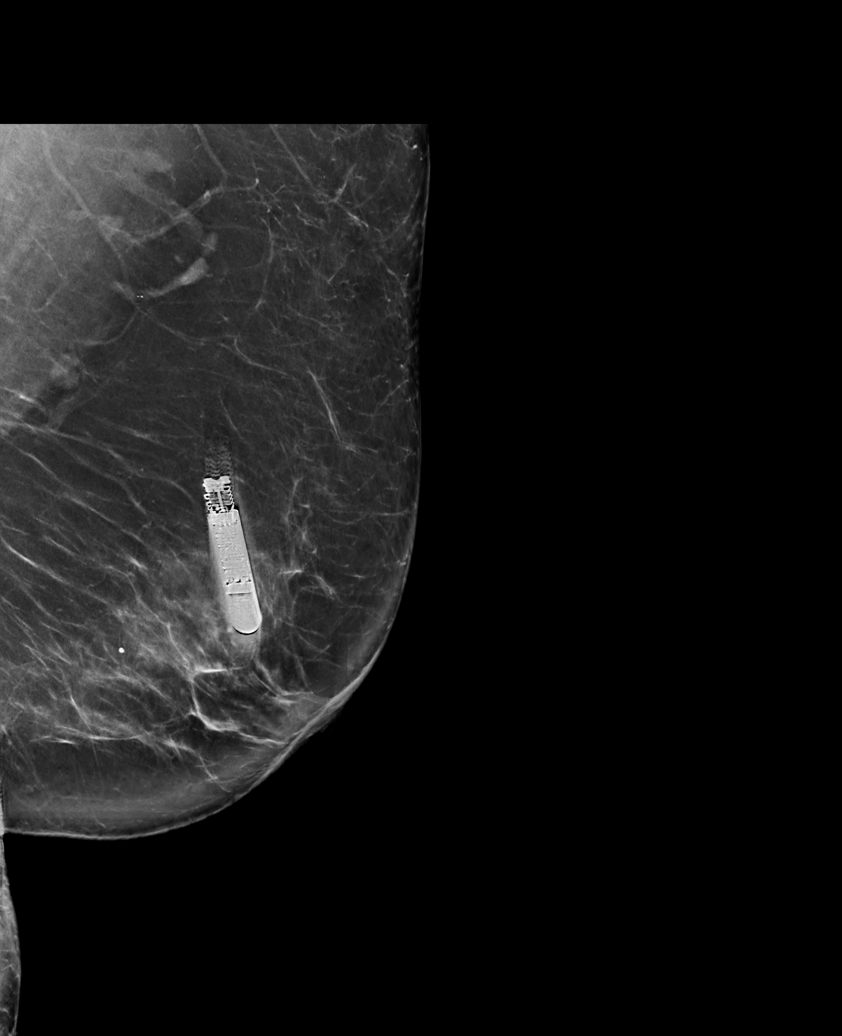

[L MLO tomo · tomo slice 49/98.0]
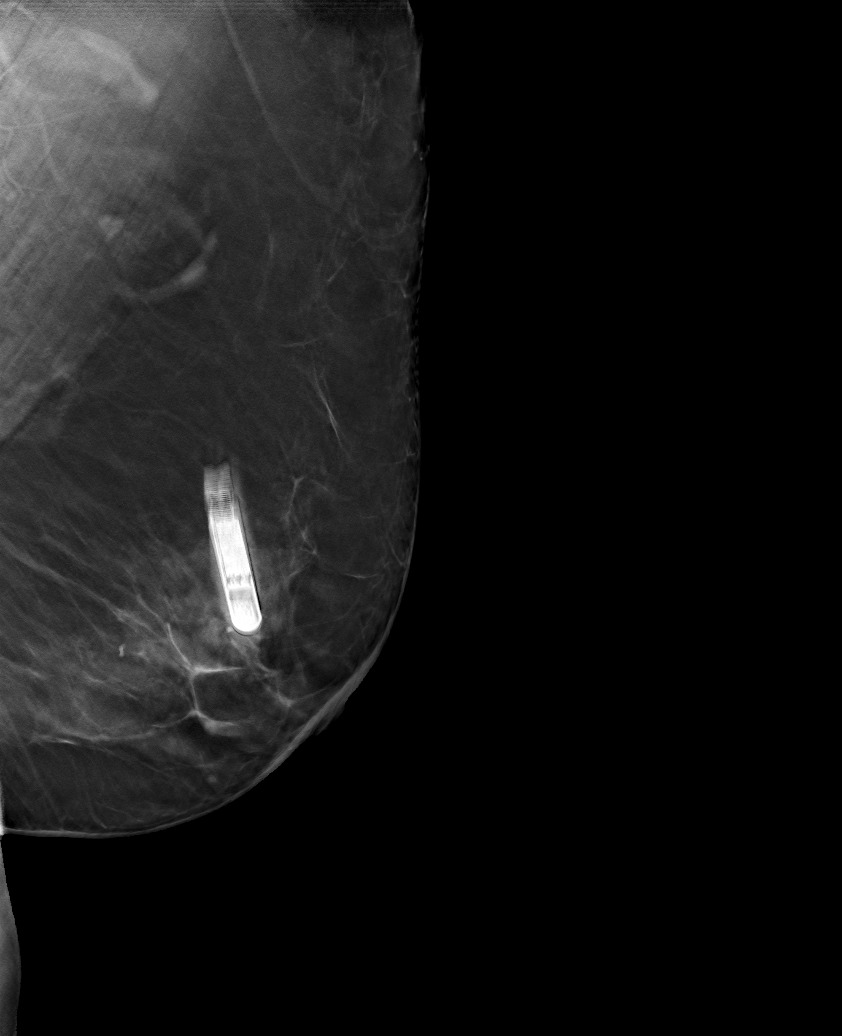

[6 of 30 positions shown; findings below may reference images not displayed]

ACR Breast Density Category b: There are scattered areas of
fibroglandular density.
FINDINGS: There are no findings suspicious for malignancy.
IMPRESSION: No mammographic evidence of malignancy. A result letter of this
screening mammogram will be mailed directly to the patient.

RECOMMENDATION:
Screening mammogram in one year. (Code:51-O-LD2)

BI-RADS CATEGORY  1: Negative.
